# Patient Record
Sex: Male | Born: 1960 | Race: White | Hispanic: No | Marital: Married | Smoking: Never smoker
Health system: Southern US, Community
[De-identification: ages and names within clinical notes are randomized; demographics above are authoritative.]

## PROBLEM LIST (undated history)

## (undated) DIAGNOSIS — K219 Gastro-esophageal reflux disease without esophagitis: Secondary | ICD-10-CM

## (undated) DIAGNOSIS — I82409 Acute embolism and thrombosis of unspecified deep veins of unspecified lower extremity: Secondary | ICD-10-CM

## (undated) DIAGNOSIS — I10 Essential (primary) hypertension: Secondary | ICD-10-CM

## (undated) DIAGNOSIS — R51 Headache: Secondary | ICD-10-CM

## (undated) HISTORY — DX: Essential (primary) hypertension: I10

## (undated) HISTORY — PX: TONSILLECTOMY: SUR1361

## (undated) HISTORY — PX: LEG TENDON SURGERY: SHX1004

## (undated) HISTORY — PX: VASECTOMY: SHX75

---

## 2005-07-15 HISTORY — PX: HEEL SPUR SURGERY: SHX665

## 2009-07-15 DIAGNOSIS — I82409 Acute embolism and thrombosis of unspecified deep veins of unspecified lower extremity: Secondary | ICD-10-CM

## 2009-07-15 HISTORY — DX: Acute embolism and thrombosis of unspecified deep veins of unspecified lower extremity: I82.409

## 2010-04-17 ENCOUNTER — Ambulatory Visit: Payer: Self-pay | Admitting: Vascular Surgery

## 2010-04-17 ENCOUNTER — Emergency Department: Admission: RE | Admit: 2010-04-17 | Discharge: 2010-04-17 | Payer: Self-pay | Admitting: Family Medicine

## 2010-08-31 ENCOUNTER — Ambulatory Visit (HOSPITAL_COMMUNITY)
Admission: RE | Admit: 2010-08-31 | Discharge: 2010-08-31 | Disposition: A | Discharge status: Home / Self Care | Payer: Managed Care, Other (non HMO) | Source: Ambulatory Visit | Attending: Family Medicine | Admitting: Family Medicine

## 2010-08-31 DIAGNOSIS — I8000 Phlebitis and thrombophlebitis of superficial vessels of unspecified lower extremity: Secondary | ICD-10-CM | POA: Insufficient documentation

## 2011-10-08 ENCOUNTER — Encounter (HOSPITAL_BASED_OUTPATIENT_CLINIC_OR_DEPARTMENT_OTHER): Payer: Self-pay | Admitting: *Deleted

## 2011-10-08 ENCOUNTER — Emergency Department (INDEPENDENT_AMBULATORY_CARE_PROVIDER_SITE_OTHER): Payer: Managed Care, Other (non HMO)

## 2011-10-08 ENCOUNTER — Emergency Department (HOSPITAL_BASED_OUTPATIENT_CLINIC_OR_DEPARTMENT_OTHER)
Admission: EM | Admit: 2011-10-08 | Discharge: 2011-10-08 | Disposition: A | Discharge status: Home / Self Care | Payer: Managed Care, Other (non HMO) | Attending: Emergency Medicine | Admitting: Emergency Medicine

## 2011-10-08 DIAGNOSIS — M542 Cervicalgia: Secondary | ICD-10-CM | POA: Insufficient documentation

## 2011-10-08 DIAGNOSIS — S139XXA Sprain of joints and ligaments of unspecified parts of neck, initial encounter: Secondary | ICD-10-CM | POA: Insufficient documentation

## 2011-10-08 DIAGNOSIS — M5020 Other cervical disc displacement, unspecified cervical region: Secondary | ICD-10-CM

## 2011-10-08 DIAGNOSIS — S161XXA Strain of muscle, fascia and tendon at neck level, initial encounter: Secondary | ICD-10-CM

## 2011-10-08 DIAGNOSIS — M546 Pain in thoracic spine: Secondary | ICD-10-CM | POA: Insufficient documentation

## 2011-10-08 DIAGNOSIS — Z043 Encounter for examination and observation following other accident: Secondary | ICD-10-CM

## 2011-10-08 MED ORDER — HYDROCODONE-ACETAMINOPHEN 5-500 MG PO TABS: 1.0000 | ORAL_TABLET | Freq: Four times a day (QID) | ORAL | Status: AC | PRN
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
## 2012-05-29 ENCOUNTER — Encounter (HOSPITAL_BASED_OUTPATIENT_CLINIC_OR_DEPARTMENT_OTHER): Payer: Self-pay | Admitting: *Deleted

## 2012-05-29 NOTE — Progress Notes (Signed)
Newly dx HTN-not had ekg 2-3 yr-not sure where-will need ekg istat-works for [employer]-in [place] till Tuesday pm

## 2012-06-03 ENCOUNTER — Ambulatory Visit (HOSPITAL_BASED_OUTPATIENT_CLINIC_OR_DEPARTMENT_OTHER): Payer: Managed Care, Other (non HMO) | Admitting: Certified Registered"

## 2012-06-03 ENCOUNTER — Encounter (HOSPITAL_BASED_OUTPATIENT_CLINIC_OR_DEPARTMENT_OTHER): Payer: Self-pay | Admitting: Certified Registered"

## 2012-06-03 ENCOUNTER — Encounter (HOSPITAL_BASED_OUTPATIENT_CLINIC_OR_DEPARTMENT_OTHER): Payer: Self-pay | Admitting: *Deleted

## 2012-06-03 ENCOUNTER — Ambulatory Visit (HOSPITAL_BASED_OUTPATIENT_CLINIC_OR_DEPARTMENT_OTHER)
Admission: RE | Admit: 2012-06-03 | Discharge: 2012-06-03 | Disposition: A | Discharge status: Home / Self Care | Payer: Managed Care, Other (non HMO) | Source: Ambulatory Visit | Attending: Orthopedic Surgery | Admitting: Orthopedic Surgery

## 2012-06-03 ENCOUNTER — Encounter (HOSPITAL_BASED_OUTPATIENT_CLINIC_OR_DEPARTMENT_OTHER)
Admission: RE | Disposition: A | Discharge status: Home / Self Care | Payer: Self-pay | Source: Ambulatory Visit | Attending: Orthopedic Surgery

## 2012-06-03 DIAGNOSIS — I10 Essential (primary) hypertension: Secondary | ICD-10-CM | POA: Insufficient documentation

## 2012-06-03 DIAGNOSIS — M2020 Hallux rigidus, unspecified foot: Secondary | ICD-10-CM | POA: Insufficient documentation

## 2012-06-03 DIAGNOSIS — K219 Gastro-esophageal reflux disease without esophagitis: Secondary | ICD-10-CM | POA: Insufficient documentation

## 2012-06-03 HISTORY — PX: CHEILECTOMY: SHX1336

## 2012-06-03 HISTORY — DX: Essential (primary) hypertension: I10

## 2012-06-03 HISTORY — DX: Headache: R51

## 2012-06-03 HISTORY — DX: Acute embolism and thrombosis of unspecified deep veins of unspecified lower extremity: I82.409

## 2012-06-03 HISTORY — DX: Gastro-esophageal reflux disease without esophagitis: K21.9

## 2012-06-03 LAB — POCT I-STAT, CHEM 8
BUN: 15 mg/dL (ref 6–23)
Calcium, Ion: 1.13 mmol/L (ref 1.12–1.23)
Creatinine, Ser: 1 mg/dL (ref 0.50–1.35)
TCO2: 25 mmol/L (ref 0–100)

## 2012-06-03 SURGERY — CHEILECTOMY
Anesthesia: General | Site: Foot | Laterality: Bilateral | Wound class: Clean

## 2012-06-03 MED ORDER — SODIUM CHLORIDE 0.9 % IV SOLN: INTRAVENOUS | Status: DC

## 2012-06-03 MED ORDER — BUPIVACAINE HCL (PF) 0.5 % IJ SOLN
INTRAMUSCULAR | Status: DC | PRN
  Administered 2012-06-03: 4 mL
  Administered 2012-06-03: 3 mL

## 2012-06-03 MED ORDER — ONDANSETRON HCL 4 MG/2ML IJ SOLN
INTRAMUSCULAR | Status: DC | PRN
  Administered 2012-06-03: 4 mg via INTRAVENOUS

## 2012-06-03 MED ORDER — CHLORHEXIDINE GLUCONATE 4 % EX LIQD: 60.0000 mL | Freq: Once | CUTANEOUS | Status: DC

## 2012-06-03 MED ORDER — CEFAZOLIN SODIUM-DEXTROSE 2-3 GM-% IV SOLR
2.0000 g | INTRAVENOUS | Status: AC
  Administered 2012-06-03: 2 g via INTRAVENOUS

## 2012-06-03 MED ORDER — DEXAMETHASONE SODIUM PHOSPHATE 4 MG/ML IJ SOLN
INTRAMUSCULAR | Status: DC | PRN
  Administered 2012-06-03: 10 mg via INTRAVENOUS

## 2012-06-03 MED ORDER — METHOCARBAMOL 500 MG PO TABS: 500.0000 mg | ORAL_TABLET | Freq: Three times a day (TID) | ORAL | Status: DC

## 2012-06-03 MED ORDER — LIDOCAINE HCL (CARDIAC) 20 MG/ML IV SOLN
INTRAVENOUS | Status: DC | PRN
  Administered 2012-06-03: 70 mg via INTRAVENOUS

## 2012-06-03 MED ORDER — HYDROCODONE-ACETAMINOPHEN 5-325 MG PO TABS: 1.0000 | ORAL_TABLET | Freq: Four times a day (QID) | ORAL | Status: DC | PRN

## 2012-06-03 MED ORDER — DIPHENHYDRAMINE HCL 50 MG/ML IJ SOLN: 6.2500 mg | INTRAMUSCULAR | Status: DC | PRN

## 2012-06-03 MED ORDER — FENTANYL CITRATE 0.05 MG/ML IJ SOLN
INTRAMUSCULAR | Status: DC | PRN
  Administered 2012-06-03: 25 ug via INTRAVENOUS
  Administered 2012-06-03: 100 ug via INTRAVENOUS
  Administered 2012-06-03: 50 ug via INTRAVENOUS

## 2012-06-03 MED ORDER — OXYCODONE HCL 5 MG/5ML PO SOLN: 5.0000 mg | Freq: Once | ORAL | Status: DC | PRN

## 2012-06-03 MED ORDER — PROPOFOL 10 MG/ML IV BOLUS
INTRAVENOUS | Status: DC | PRN
  Administered 2012-06-03: 200 mg via INTRAVENOUS

## 2012-06-03 MED ORDER — HYDROMORPHONE HCL PF 1 MG/ML IJ SOLN
0.2500 mg | INTRAMUSCULAR | Status: DC | PRN
  Administered 2012-06-03 (×2): 0.5 mg via INTRAVENOUS

## 2012-06-03 MED ORDER — MIDAZOLAM HCL 5 MG/5ML IJ SOLN
INTRAMUSCULAR | Status: DC | PRN
  Administered 2012-06-03: 2 mg via INTRAVENOUS

## 2012-06-03 MED ORDER — LACTATED RINGERS IV SOLN
INTRAVENOUS | Status: DC
  Administered 2012-06-03 (×2): via INTRAVENOUS

## 2012-06-03 MED ORDER — OXYCODONE HCL 5 MG PO TABS
5.0000 mg | ORAL_TABLET | Freq: Once | ORAL | Status: DC | PRN
Start: 2012-06-03 — End: 2012-06-03

## 2012-06-03 MED ORDER — ONDANSETRON HCL 4 MG/2ML IJ SOLN: 4.0000 mg | Freq: Once | INTRAMUSCULAR | Status: DC | PRN

## 2012-06-03 SURGICAL SUPPLY — 54 items
BANDAGE ELASTIC 4 VELCRO ST LF (GAUZE/BANDAGES/DRESSINGS) ×4 IMPLANT
BLADE OSC/SAG .038X5.5 CUT EDG (BLADE) ×2 IMPLANT
BLADE SURG 15 STRL LF DISP TIS (BLADE) ×3 IMPLANT
BLADE SURG 15 STRL SS (BLADE) ×3
BRUSH SCRUB EZ PLAIN DRY (MISCELLANEOUS) ×4 IMPLANT
CLOTH BEACON ORANGE TIMEOUT ST (SAFETY) ×2 IMPLANT
COVER TABLE BACK 60X90 (DRAPES) ×2 IMPLANT
CUFF TOURNIQUET SINGLE 34IN LL (TOURNIQUET CUFF) ×4 IMPLANT
DRAPE EXTREMITY T 121X128X90 (DRAPE) ×4 IMPLANT
DRAPE SURG 17X23 STRL (DRAPES) ×4 IMPLANT
DRSG PAD ABDOMINAL 8X10 ST (GAUZE/BANDAGES/DRESSINGS) ×2 IMPLANT
DURA STEPPER LG (CAST SUPPLIES) IMPLANT
DURA STEPPER MED (CAST SUPPLIES) IMPLANT
DURA STEPPER SML (CAST SUPPLIES) IMPLANT
ELECT REM PT RETURN 9FT ADLT (ELECTROSURGICAL) ×2
ELECTRODE REM PT RTRN 9FT ADLT (ELECTROSURGICAL) ×1 IMPLANT
GAUZE SPONGE 4X4 16PLY XRAY LF (GAUZE/BANDAGES/DRESSINGS) IMPLANT
GAUZE XEROFORM 1X8 LF (GAUZE/BANDAGES/DRESSINGS) ×2 IMPLANT
GLOVE BIO SURGEON STRL SZ 6.5 (GLOVE) ×2 IMPLANT
GLOVE BIO SURGEON STRL SZ8 (GLOVE) ×2 IMPLANT
GLOVE BIOGEL PI IND STRL 7.0 (GLOVE) ×1 IMPLANT
GLOVE BIOGEL PI IND STRL 8 (GLOVE) ×2 IMPLANT
GLOVE BIOGEL PI INDICATOR 7.0 (GLOVE) ×1
GLOVE BIOGEL PI INDICATOR 8 (GLOVE) ×2
GLOVE SURG SS PI 8.0 STRL IVOR (GLOVE) ×2 IMPLANT
GOWN BRE IMP PREV XXLGXLNG (GOWN DISPOSABLE) ×2 IMPLANT
GOWN PREVENTION PLUS XLARGE (GOWN DISPOSABLE) ×4 IMPLANT
NEEDLE HYPO 22GX1.5 SAFETY (NEEDLE) ×2 IMPLANT
NS IRRIG 1000ML POUR BTL (IV SOLUTION) ×4 IMPLANT
PACK BASIN DAY SURGERY FS (CUSTOM PROCEDURE TRAY) ×2 IMPLANT
PAD CAST 4YDX4 CTTN HI CHSV (CAST SUPPLIES) ×1 IMPLANT
PADDING CAST ABS 4INX4YD NS (CAST SUPPLIES) ×1
PADDING CAST ABS COTTON 4X4 ST (CAST SUPPLIES) ×1 IMPLANT
PADDING CAST COTTON 4X4 STRL (CAST SUPPLIES) ×1
PENCIL BUTTON HOLSTER BLD 10FT (ELECTRODE) ×2 IMPLANT
SHEET MEDIUM DRAPE 40X70 STRL (DRAPES) ×4 IMPLANT
SPONGE GAUZE 4X4 12PLY (GAUZE/BANDAGES/DRESSINGS) ×4 IMPLANT
STOCKINETTE 6  STRL (DRAPES) ×2
STOCKINETTE 6 STRL (DRAPES) ×2 IMPLANT
SUCTION FRAZIER TIP 10 FR DISP (SUCTIONS) IMPLANT
SUT BONE WAX W31G (SUTURE) ×2 IMPLANT
SUT ETHILON 4 0 PS 2 18 (SUTURE) ×4 IMPLANT
SUT MON AB 4-0 PC3 18 (SUTURE) IMPLANT
SUT VIC AB 2-0 PS2 27 (SUTURE) IMPLANT
SUT VIC AB 3-0 PS1 18 (SUTURE) ×2
SUT VIC AB 3-0 PS1 18XBRD (SUTURE) ×2 IMPLANT
SYR 20CC LL (SYRINGE) IMPLANT
SYR BULB 3OZ (MISCELLANEOUS) ×2 IMPLANT
SYR CONTROL 10ML LL (SYRINGE) IMPLANT
TOWEL OR 17X24 6PK STRL BLUE (TOWEL DISPOSABLE) ×2 IMPLANT
TOWEL OR NON WOVEN STRL DISP B (DISPOSABLE) ×2 IMPLANT
TUBE CONNECTING 20X1/4 (TUBING) ×4 IMPLANT
UNDERPAD 30X30 INCONTINENT (UNDERPADS AND DIAPERS) ×2 IMPLANT
WATER STERILE IRR 1000ML POUR (IV SOLUTION) IMPLANT

## 2012-06-03 NOTE — Brief Op Note (Signed)
06/03/2012  3:05 PM  PATIENT:  [place]  51 y.o. male  PRE-OPERATIVE DIAGNOSIS:  bilateral halux rigidus  POST-OPERATIVE DIAGNOSIS:  bilateral halux rigidus  PROCEDURE:  Procedure(s) (LRB) with comments: CHEILECTOMY (Bilateral) - bilateral great toe cheilectomy  SURGEON:  Surgeon(s) and Role:    * Sherri Rad, MD - Primary  PHYSICIAN ASSISTANT: Rexene Edison, PAC   ASSISTANTS: Rexene Edison, [facility]    ANESTHESIA:   general  EBL:  Total I/O In: 1400 [I.V.:1400] Out: -   BLOOD ADMINISTERED:none  DRAINS: none   LOCAL MEDICATIONS USED:  MARCAINE     SPECIMEN:  No Specimen  DISPOSITION OF SPECIMEN:  N/A  COUNTS:  YES  TOURNIQUET:   Total Tourniquet Time Documented: Thigh (Left) - 48 minutes Thigh (Right) - 48 minutes  DICTATION: .Other Dictation: Dictation Number [id number]  PLAN OF CARE: Discharge to home after PACU  PATIENT DISPOSITION:  PACU - hemodynamically stable.   Delay start of Pharmacological VTE agent (>24hrs) due to surgical blood loss or risk of bleeding: no

## 2012-06-03 NOTE — Progress Notes (Signed)
Pt states itching is better.  Does not want to fill Norco RX "afraid it may cause itching". States in past had itching with oxycodone did talk with Delane Ginger last week  And was going to try norco but now changed mind. TC to Dr. Lestine Box office RX changed to Tramadol called both Rx for Tramadol and Robaxin to [facility]. Pt and girlfriend aware. Dr Jean Rosenthal wants pt to wait until Rx picked up and then ok to take pain med since last dose of Dilaudid at 1531. Pt and girlfriend verbalized understanding. Printed RX shredded.

## 2012-06-03 NOTE — H&P (Signed)
  H&P documentation: Placed to be scanned history and physical exam in chart.  -History and Physical Reviewed  -Patient has been re-examined  -No change in the plan of care  [place] A  

## 2012-06-03 NOTE — Anesthesia Procedure Notes (Addendum)
Performed by: Verlan Friends   Procedure Name: LMA Insertion Date/Time: 06/03/2012 1:38 PM Performed by: Verlan Friends Pre-anesthesia Checklist: Patient identified, Emergency Drugs available, Suction available, Patient being monitored and Timeout performed Patient Re-evaluated:Patient Re-evaluated prior to inductionOxygen Delivery Method: Circle System Utilized Preoxygenation: Pre-oxygenation with 100% oxygen Intubation Type: IV induction Ventilation: Mask ventilation without difficulty LMA: LMA inserted LMA Size: 5.0 Number of attempts: 1 Airway Equipment and Method: bite block Placement Confirmation: positive ETCO2 Tube secured with: Tape Dental Injury: Teeth and Oropharynx as per pre-operative assessment  Comments: Incisal small chips on #8 and 9.  This was noted before insertion of LMA

## 2012-06-03 NOTE — Anesthesia Postprocedure Evaluation (Signed)
  Anesthesia Post-op Note  Patient: [place]  Procedure(s) Performed: Procedure(s) (LRB) with comments: CHEILECTOMY (Bilateral) - bilateral great toe cheilectomy  Patient Location: PACU  Anesthesia Type:General  Level of Consciousness: awake, alert , oriented and patient cooperative  Airway and Oxygen Therapy: Patient Spontanous Breathing  Post-op Pain: mild  Post-op Assessment: Post-op Vital signs reviewed, Patient's Cardiovascular Status Stable, Respiratory Function Stable, Patent Airway, No signs of Nausea or vomiting, Adequate PO intake and Pain level controlled  Post-op Vital Signs: Reviewed and stable  Complications: No apparent anesthesia complications

## 2012-06-03 NOTE — Transfer of Care (Signed)
Immediate Anesthesia Transfer of Care Note  Patient: [place]  Procedure(s) Performed: Procedure(s) (LRB) with comments: CHEILECTOMY (Bilateral) - bilateral great toe cheilectomy  Patient Location: PACU  Anesthesia Type:General  Level of Consciousness: awake, alert , oriented and patient cooperative  Airway & Oxygen Therapy: Patient Spontanous Breathing and Patient connected to face mask oxygen  Post-op Assessment: Report given to PACU RN and Post -op Vital signs reviewed and stable  Post vital signs: Reviewed and stable  Complications: No apparent anesthesia complications

## 2012-06-03 NOTE — Anesthesia Preprocedure Evaluation (Signed)
Anesthesia Evaluation  Patient identified by MRN, date of birth, ID band Patient awake    Reviewed: Allergy & Precautions, H&P , NPO status , Patient's Chart, lab work & pertinent test results  Airway Mallampati: I TM Distance: >3 FB Neck ROM: Full    Dental  (+) Teeth Intact and Dental Advisory Given,    Pulmonary  breath sounds clear to auscultation        Cardiovascular hypertension, Pt. on medications Rhythm:Regular Rate:Normal     Neuro/Psych  Headaches,    GI/Hepatic GERD-  Medicated and Controlled,  Endo/Other    Renal/GU      Musculoskeletal   Abdominal   Peds  Hematology   Anesthesia Other Findings   Reproductive/Obstetrics                           Anesthesia Physical Anesthesia Plan  ASA: II  Anesthesia Plan: General   Post-op Pain Management:    Induction: Intravenous  Airway Management Planned: LMA  Additional Equipment:   Intra-op Plan:   Post-operative Plan: Extubation in OR  Informed Consent: I have reviewed the patients History and Physical, chart, labs and discussed the procedure including the risks, benefits and alternatives for the proposed anesthesia with the patient or authorized representative who has indicated his/her understanding and acceptance.   Dental advisory given  Plan Discussed with: CRNA, Anesthesiologist and Surgeon  Anesthesia Plan Comments:         Anesthesia Quick Evaluation

## 2012-06-04 ENCOUNTER — Encounter (HOSPITAL_BASED_OUTPATIENT_CLINIC_OR_DEPARTMENT_OTHER): Payer: Self-pay | Admitting: Orthopedic Surgery

## 2012-06-04 NOTE — Op Note (Signed)
NAMEMarland Kitchen  MILLARD, DONAGHY              ACCOUNT NO.:  [id number]  MEDICAL RECORD NO.:  [id number]  LOCATION:                                 FACILITY:  PHYSICIAN:  Leonides Grills, M.D.     DATE OF BIRTH:  07/23/1960  DATE OF PROCEDURE:  06/03/2012 DATE OF DISCHARGE:                              OPERATIVE REPORT   PREOPERATIVE DIAGNOSIS:  Bilateral hallux rigidus.  POSTOPERATIVE DIAGNOSIS:  Bilateral hallux rigidus.  PROCEDURE PERFORMED:  Bilateral great toe cheilectomies.  ANESTHESIA:  General.  SURGEON:  Leonides Grills, M.D.  ASSISTANT:  Richardean Canal, PA-C  ESTIMATED BLOOD LOSS:  Minimal.  TOURNIQUET TIME:  COMPLICATIONS:  None.  DISPOSITION:  Stable to PR.  INDICATION:  This is a 51 year old gentleman, who has had persistent prolonged and progressive dorsal bilateral great toe pain at the MTP joint.  Due to the above pathology, it was interfering with his life despite conservative management.  He was consented for the procedure. All risks, infection, vessel injury, persistent pain, worse pain, prolonged recovery, stiffness, arthritis, possibility of future fusion, DVT, PE were all explained.  Questions were encouraged and answered.  OPERATIVE NOTE:  The patient was brought to the operating room and placed in supine position.  After adequate general anesthesia administered as well as Ancef 1 g IV piggyback, bilateral lower extremities were prepped and draped in a sterile manner, proximally placed thigh tourniquets.  Limb was gravity exsanguinated.  Tourniquet was elevated to 290 mmHg.  A longitudinal incision on the dorsomedial aspect, left great toe MTP joint was then made.  Dissection was carried down through skin.  Hemostasis was obtained.  EHL tendon was identified and protected within its tenosynovial sheath and retracted out of harm's way laterally throughout the case.  Longitudinal capsulotomy was then made.  Soft tissue was elevated medial and laterally, then with  a sagittal saw and the dorsal spur off the first metatarsal head was then removed.  Once this was done, the spur off the dorsal aspect of the proximal phalanx was removed as well.  The remaining part of cartilage looked good.  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  We then explored both the medial and lateral gutters and spurs removed from this area as well.  We then ranged the joint after the area was copiously irrigated with normal saline, and there was no impingement whatsoever.  The range of motion of the great toe and MTP joint was significantly improved.  The area was copiously irrigated with normal saline once again.  Bone wax applied to exposed bony surfaces. The capsule closed with 3-0 Vicryl stitch protecting the EHL tendon at all times.  Tourniquet was deflated and hemostasis was obtained.  There was no pulsatile bleeding.  The skin was closed with 4-0 nylon stitch. The exact same procedure was done to the right side as well, as described for the left.  Sterile dressings were applied.  Hard-sole shoe was applied.  The patient stable to PR.     Leonides Grills, M.D.     PB/MEDQ  D:  06/03/2012  T:  06/04/2012  Job:  [id number]

## 2012-12-11 ENCOUNTER — Ambulatory Visit (INDEPENDENT_AMBULATORY_CARE_PROVIDER_SITE_OTHER): Payer: BLUE CROSS/BLUE SHIELD | Admitting: Psychology

## 2012-12-11 DIAGNOSIS — F4322 Adjustment disorder with anxiety: Secondary | ICD-10-CM

## 2012-12-23 ENCOUNTER — Ambulatory Visit (INDEPENDENT_AMBULATORY_CARE_PROVIDER_SITE_OTHER): Payer: BLUE CROSS/BLUE SHIELD | Admitting: Psychology

## 2012-12-23 DIAGNOSIS — F4322 Adjustment disorder with anxiety: Secondary | ICD-10-CM

## 2012-12-28 ENCOUNTER — Ambulatory Visit (INDEPENDENT_AMBULATORY_CARE_PROVIDER_SITE_OTHER): Payer: BLUE CROSS/BLUE SHIELD | Admitting: Psychology

## 2012-12-28 DIAGNOSIS — F4323 Adjustment disorder with mixed anxiety and depressed mood: Secondary | ICD-10-CM

## 2013-01-04 ENCOUNTER — Ambulatory Visit (INDEPENDENT_AMBULATORY_CARE_PROVIDER_SITE_OTHER): Payer: BLUE CROSS/BLUE SHIELD | Admitting: Psychology

## 2013-01-04 DIAGNOSIS — F4322 Adjustment disorder with anxiety: Secondary | ICD-10-CM

## 2013-01-11 ENCOUNTER — Ambulatory Visit (INDEPENDENT_AMBULATORY_CARE_PROVIDER_SITE_OTHER): Payer: BLUE CROSS/BLUE SHIELD | Admitting: Psychology

## 2013-01-11 DIAGNOSIS — F4322 Adjustment disorder with anxiety: Secondary | ICD-10-CM

## 2013-01-20 ENCOUNTER — Ambulatory Visit: Payer: BLUE CROSS/BLUE SHIELD | Admitting: Psychology

## 2013-01-25 ENCOUNTER — Ambulatory Visit: Payer: BLUE CROSS/BLUE SHIELD | Admitting: Psychology

## 2013-02-08 ENCOUNTER — Ambulatory Visit (INDEPENDENT_AMBULATORY_CARE_PROVIDER_SITE_OTHER): Payer: BLUE CROSS/BLUE SHIELD | Admitting: Psychology

## 2013-02-08 DIAGNOSIS — F4322 Adjustment disorder with anxiety: Secondary | ICD-10-CM

## 2013-02-15 ENCOUNTER — Ambulatory Visit (INDEPENDENT_AMBULATORY_CARE_PROVIDER_SITE_OTHER): Payer: BLUE CROSS/BLUE SHIELD | Admitting: Psychology

## 2013-02-15 DIAGNOSIS — F4322 Adjustment disorder with anxiety: Secondary | ICD-10-CM

## 2013-02-22 ENCOUNTER — Ambulatory Visit (INDEPENDENT_AMBULATORY_CARE_PROVIDER_SITE_OTHER): Payer: BLUE CROSS/BLUE SHIELD | Admitting: Psychology

## 2013-02-22 DIAGNOSIS — F4322 Adjustment disorder with anxiety: Secondary | ICD-10-CM

## 2013-02-25 ENCOUNTER — Ambulatory Visit (INDEPENDENT_AMBULATORY_CARE_PROVIDER_SITE_OTHER): Payer: BLUE CROSS/BLUE SHIELD | Admitting: Licensed Clinical Social Worker

## 2013-02-25 DIAGNOSIS — F4322 Adjustment disorder with anxiety: Secondary | ICD-10-CM

## 2013-03-01 ENCOUNTER — Ambulatory Visit: Payer: BLUE CROSS/BLUE SHIELD | Admitting: Psychology

## 2013-03-01 ENCOUNTER — Ambulatory Visit (INDEPENDENT_AMBULATORY_CARE_PROVIDER_SITE_OTHER): Payer: BLUE CROSS/BLUE SHIELD | Admitting: Licensed Clinical Social Worker

## 2013-03-01 DIAGNOSIS — F4322 Adjustment disorder with anxiety: Secondary | ICD-10-CM

## 2013-03-08 ENCOUNTER — Ambulatory Visit (INDEPENDENT_AMBULATORY_CARE_PROVIDER_SITE_OTHER): Payer: BLUE CROSS/BLUE SHIELD | Admitting: Psychology

## 2013-03-08 DIAGNOSIS — F4322 Adjustment disorder with anxiety: Secondary | ICD-10-CM

## 2013-03-09 ENCOUNTER — Ambulatory Visit: Payer: BLUE CROSS/BLUE SHIELD | Admitting: Licensed Clinical Social Worker

## 2013-03-09 ENCOUNTER — Ambulatory Visit (INDEPENDENT_AMBULATORY_CARE_PROVIDER_SITE_OTHER): Payer: BLUE CROSS/BLUE SHIELD | Admitting: Licensed Clinical Social Worker

## 2013-03-09 DIAGNOSIS — F4323 Adjustment disorder with mixed anxiety and depressed mood: Secondary | ICD-10-CM

## 2013-03-16 ENCOUNTER — Ambulatory Visit: Payer: BLUE CROSS/BLUE SHIELD | Admitting: Psychology

## 2013-03-17 ENCOUNTER — Ambulatory Visit (INDEPENDENT_AMBULATORY_CARE_PROVIDER_SITE_OTHER): Payer: BLUE CROSS/BLUE SHIELD | Admitting: Licensed Clinical Social Worker

## 2013-03-17 DIAGNOSIS — F4323 Adjustment disorder with mixed anxiety and depressed mood: Secondary | ICD-10-CM

## 2013-03-22 ENCOUNTER — Ambulatory Visit (INDEPENDENT_AMBULATORY_CARE_PROVIDER_SITE_OTHER): Payer: BLUE CROSS/BLUE SHIELD | Admitting: Psychology

## 2013-03-22 DIAGNOSIS — F4322 Adjustment disorder with anxiety: Secondary | ICD-10-CM

## 2013-03-23 ENCOUNTER — Ambulatory Visit (INDEPENDENT_AMBULATORY_CARE_PROVIDER_SITE_OTHER): Payer: BLUE CROSS/BLUE SHIELD | Admitting: Licensed Clinical Social Worker

## 2013-03-23 DIAGNOSIS — F4323 Adjustment disorder with mixed anxiety and depressed mood: Secondary | ICD-10-CM

## 2013-03-29 ENCOUNTER — Ambulatory Visit (INDEPENDENT_AMBULATORY_CARE_PROVIDER_SITE_OTHER): Payer: BLUE CROSS/BLUE SHIELD | Admitting: Psychology

## 2013-03-29 DIAGNOSIS — F4322 Adjustment disorder with anxiety: Secondary | ICD-10-CM

## 2013-04-01 ENCOUNTER — Ambulatory Visit (INDEPENDENT_AMBULATORY_CARE_PROVIDER_SITE_OTHER): Payer: BLUE CROSS/BLUE SHIELD | Admitting: Licensed Clinical Social Worker

## 2013-04-01 DIAGNOSIS — F4322 Adjustment disorder with anxiety: Secondary | ICD-10-CM

## 2013-04-05 ENCOUNTER — Ambulatory Visit (INDEPENDENT_AMBULATORY_CARE_PROVIDER_SITE_OTHER): Payer: BLUE CROSS/BLUE SHIELD | Admitting: Psychology

## 2013-04-05 DIAGNOSIS — F4322 Adjustment disorder with anxiety: Secondary | ICD-10-CM

## 2013-04-12 ENCOUNTER — Ambulatory Visit: Payer: 59 | Admitting: Psychology

## 2013-04-19 ENCOUNTER — Ambulatory Visit: Payer: BLUE CROSS/BLUE SHIELD | Admitting: Psychology

## 2013-04-19 ENCOUNTER — Ambulatory Visit (INDEPENDENT_AMBULATORY_CARE_PROVIDER_SITE_OTHER): Payer: BLUE CROSS/BLUE SHIELD | Admitting: Psychology

## 2013-04-19 DIAGNOSIS — F4322 Adjustment disorder with anxiety: Secondary | ICD-10-CM

## 2013-04-26 ENCOUNTER — Ambulatory Visit: Payer: Self-pay | Admitting: Psychology

## 2013-05-03 ENCOUNTER — Ambulatory Visit: Payer: 59 | Admitting: Psychology

## 2013-05-06 ENCOUNTER — Ambulatory Visit (INDEPENDENT_AMBULATORY_CARE_PROVIDER_SITE_OTHER): Payer: BLUE CROSS/BLUE SHIELD | Admitting: Psychology

## 2013-05-06 DIAGNOSIS — F4322 Adjustment disorder with anxiety: Secondary | ICD-10-CM

## 2013-05-24 ENCOUNTER — Ambulatory Visit (INDEPENDENT_AMBULATORY_CARE_PROVIDER_SITE_OTHER): Payer: BLUE CROSS/BLUE SHIELD | Admitting: Psychology

## 2013-05-24 DIAGNOSIS — F4322 Adjustment disorder with anxiety: Secondary | ICD-10-CM

## 2013-05-31 ENCOUNTER — Ambulatory Visit (INDEPENDENT_AMBULATORY_CARE_PROVIDER_SITE_OTHER): Payer: BLUE CROSS/BLUE SHIELD | Admitting: Psychology

## 2013-05-31 DIAGNOSIS — F4322 Adjustment disorder with anxiety: Secondary | ICD-10-CM

## 2013-06-14 ENCOUNTER — Ambulatory Visit: Payer: 59 | Admitting: Psychology

## 2013-06-21 ENCOUNTER — Ambulatory Visit: Payer: 59 | Admitting: Psychology

## 2013-06-28 ENCOUNTER — Ambulatory Visit (INDEPENDENT_AMBULATORY_CARE_PROVIDER_SITE_OTHER): Payer: BLUE CROSS/BLUE SHIELD | Admitting: Psychology

## 2013-06-28 DIAGNOSIS — F4322 Adjustment disorder with anxiety: Secondary | ICD-10-CM

## 2013-07-05 ENCOUNTER — Ambulatory Visit (INDEPENDENT_AMBULATORY_CARE_PROVIDER_SITE_OTHER): Payer: BLUE CROSS/BLUE SHIELD | Admitting: Psychology

## 2013-07-05 DIAGNOSIS — F4322 Adjustment disorder with anxiety: Secondary | ICD-10-CM
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
## 2013-08-05 ENCOUNTER — Ambulatory Visit (INDEPENDENT_AMBULATORY_CARE_PROVIDER_SITE_OTHER): Payer: 59 | Admitting: Psychology

## 2013-08-05 DIAGNOSIS — F4322 Adjustment disorder with anxiety: Secondary | ICD-10-CM

## 2013-09-02 ENCOUNTER — Ambulatory Visit (INDEPENDENT_AMBULATORY_CARE_PROVIDER_SITE_OTHER): Payer: 59 | Admitting: Psychology

## 2013-09-02 DIAGNOSIS — F4322 Adjustment disorder with anxiety: Secondary | ICD-10-CM

## 2013-10-01 ENCOUNTER — Ambulatory Visit: Payer: 59 | Admitting: Psychology

## 2014-02-01 ENCOUNTER — Other Ambulatory Visit: Payer: Self-pay | Admitting: Family Medicine

## 2014-02-01 DIAGNOSIS — I809 Phlebitis and thrombophlebitis of unspecified site: Secondary | ICD-10-CM

## 2014-02-02 ENCOUNTER — Ambulatory Visit
Admission: RE | Admit: 2014-02-02 | Discharge: 2014-02-02 | Disposition: A | Discharge status: Home / Self Care | Payer: 59 | Source: Ambulatory Visit | Attending: Family Medicine | Admitting: Family Medicine

## 2014-02-02 DIAGNOSIS — I809 Phlebitis and thrombophlebitis of unspecified site: Secondary | ICD-10-CM

## 2014-03-04 ENCOUNTER — Telehealth: Payer: Self-pay | Admitting: Hematology & Oncology

## 2014-03-04 NOTE — Telephone Encounter (Signed)
Left vm today to schedule  NEW PATIENT appt for next week per Dr. E. °

## 2014-03-09 ENCOUNTER — Encounter: Payer: Self-pay | Admitting: Hematology & Oncology

## 2014-03-10 ENCOUNTER — Telehealth: Payer: Self-pay | Admitting: Hematology & Oncology

## 2014-03-10 NOTE — Telephone Encounter (Signed)
Left vm w NEW PATIENT today to remind them of their appointment with Dr. Ennever. Also, advised them to bring all medication bottles and insurance card information. ° °

## 2014-03-11 ENCOUNTER — Ambulatory Visit: Payer: Self-pay

## 2014-03-11 ENCOUNTER — Other Ambulatory Visit: Payer: Self-pay | Admitting: Lab

## 2014-03-11 ENCOUNTER — Ambulatory Visit: Payer: Self-pay | Admitting: Hematology & Oncology

## 2014-03-15 ENCOUNTER — Encounter: Payer: Self-pay | Admitting: Hematology & Oncology

## 2014-03-15 ENCOUNTER — Ambulatory Visit: Payer: 59

## 2014-03-15 ENCOUNTER — Ambulatory Visit (HOSPITAL_BASED_OUTPATIENT_CLINIC_OR_DEPARTMENT_OTHER): Payer: 59 | Admitting: Hematology & Oncology

## 2014-03-15 ENCOUNTER — Other Ambulatory Visit (HOSPITAL_BASED_OUTPATIENT_CLINIC_OR_DEPARTMENT_OTHER): Payer: 59 | Admitting: Lab

## 2014-03-15 VITALS — BP 132/81 | HR 65 | Temp 98.2°F | Resp 18 | Ht 72.0 in | Wt 202.0 lb

## 2014-03-15 DIAGNOSIS — M7989 Other specified soft tissue disorders: Secondary | ICD-10-CM

## 2014-03-15 DIAGNOSIS — I749 Embolism and thrombosis of unspecified artery: Secondary | ICD-10-CM

## 2014-03-15 DIAGNOSIS — Z86718 Personal history of other venous thrombosis and embolism: Secondary | ICD-10-CM

## 2014-03-15 DIAGNOSIS — Z7982 Long term (current) use of aspirin: Secondary | ICD-10-CM

## 2014-03-15 DIAGNOSIS — I82409 Acute embolism and thrombosis of unspecified deep veins of unspecified lower extremity: Secondary | ICD-10-CM

## 2014-03-15 LAB — CBC WITH DIFFERENTIAL (CANCER CENTER ONLY)
BASO#: 0 10*3/uL (ref 0.0–0.2)
BASO%: 0.7 % (ref 0.0–2.0)
EOS%: 3.2 % (ref 0.0–7.0)
Eosinophils Absolute: 0.2 10*3/uL (ref 0.0–0.5)
HEMATOCRIT: 45.2 % (ref 38.7–49.9)
HEMOGLOBIN: 16 g/dL (ref 13.0–17.1)
LYMPH#: 1.9 10*3/uL (ref 0.9–3.3)
LYMPH%: 32 % (ref 14.0–48.0)
MCH: 32.2 pg (ref 28.0–33.4)
MCHC: 35.4 g/dL (ref 32.0–35.9)
MCV: 91 fL (ref 82–98)
MONO#: 0.5 10*3/uL (ref 0.1–0.9)
MONO%: 8.9 % (ref 0.0–13.0)
NEUT#: 3.2 10*3/uL (ref 1.5–6.5)
NEUT%: 55.2 % (ref 40.0–80.0)
Platelets: 214 10*3/uL (ref 145–400)
RBC: 4.97 10*6/uL (ref 4.20–5.70)
RDW: 12.4 % (ref 11.1–15.7)
WBC: 5.9 10*3/uL (ref 4.0–10.0)

## 2014-03-15 NOTE — Progress Notes (Signed)
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 He had a DVT in his left calve 5 years ago. This was treated with lovenox and coumadin and is resolved. Last year he had to drive to [place] and back 4 times and developed a superficial thrombus in his left lower leg. This happened again this year when he had to make 5-6 trips to [place] after his father passed away. His superficial thrombus developed in June in his lower left leg. He has not taken any blood thinners with this. He does take 325 of asprin daily. He wear a compression sock on that leg. He rides several miles on his bike daily. His father passed away from metastatic liver cancer this year. He has no personal or family history of blood disorders that he is aware of. His maternal grandmother had varicose veins. He is an [place] in [place]. He was born in [place] and has lived in [place] for several years. He does not smoke or drink. He has had no bleeding or pain. He denies fever, chills, n/v, cough, rash, headache, dizziness, SOB, chest pain, palpitations, abdominal pain, constipation, diarrhea, blood in urine or stool. He has some swelling and tenderness in his left foot and leg at times. He denies numbness or tingling. His appetite is good and he is drinking plenty of fluids.   ROS: All other 10 point review of systems is negative except for those issues mentioned above.    PAST MEDICAL HISTORY:   Past Medical History  Diagnosis Date  . Hypertension   . DVT (deep venous thrombosis) 2011    left lower leg   . GERD  (gastroesophageal reflux disease)   . Headache(784.0)    ALLERGIES: Allergies  Allergen Reactions  . Lisinopril Cough  . Enoxaparin Sodium Rash    MEDICATIONS:  Current Outpatient Prescriptions on File Prior to Visit  Medication Sig Dispense Refill  . amphetamine-dextroamphetamine (ADDERALL XR) 30 MG 24 hr capsule Take 30 mg by mouth every morning.      Marland Kitchen aspirin 81 MG tablet Take 81 mg by mouth daily.      . SUMAtriptan (IMITREX) 100 MG tablet Take 100 mg by mouth every 2 (two) hours as needed. For migraine       No current facility-administered medications on file prior to visit.   PAST SURGICAL HISTORY Past Surgical History  Procedure Laterality Date  . Leg tendon surgery    . Tonsillectomy    . Vasectomy    . Heel spur surgery  2007    right  . Cheilectomy  06/03/2012    Procedure: CHEILECTOMY;  Surgeon: [place] Rad, MD;  Location: [place];  Service: Orthopedics;  Laterality: Bilateral;  bilateral great toe cheilectomy   FAMILY HISTORY: No family history on file.  SOCIAL HISTORY:  reports that he quit smoking about 31 years ago. His smoking use included Cigarettes. He started smoking about 39 years ago. He has a 4 pack-year smoking history. He has never used smokeless tobacco. He reports that he drinks alcohol. He reports that he does not use  illicit drugs.  PERFORMANCE STATUS: The patient's performance status is 0 - Asymptomatic  PHYSICAL EXAM: Most Recent Vital Signs: Blood pressure 132/81, pulse 65, temperature 98.2 F (36.8 C), temperature source Oral, resp. rate 18, height 6' (1.829 m), weight 202 lb (91.627 kg). BP 132/81  Pulse 65  Temp(Src) 98.2 F (36.8 C) (Oral)  Resp 18  Ht 6' (1.829 m)  Wt 202 lb (91.627 kg)  BMI 27.39 kg/m2  General Appearance:    Alert, cooperative, no distress, appears stated age  Head:    Normocephalic, without obvious abnormality, atraumatic  Eyes:    PERRL, conjunctiva/corneas clear, EOM's intact, fundi     benign, both eyes             Throat:   Lips, mucosa, and tongue normal; teeth and gums normal  Neck:   Supple, symmetrical, trachea midline, no adenopathy;       thyroid:  No enlargement/tenderness/nodules; no carotid   bruit or JVD  Back:     Symmetric, no curvature, ROM normal, no CVA tenderness  Lungs:     Clear to auscultation bilaterally, respirations unlabored  Chest wall:    No tenderness or deformity  Heart:    Regular rate and rhythm, S1 and S2 normal, no murmur, rub   or gallop  Abdomen:     Soft, non-tender, bowel sounds active all four quadrants,    no masses, no organomegaly        Extremities:   Extremities normal, atraumatic, no cyanosis or edema  Pulses:   2+ and symmetric all extremities  Skin:   Skin color, texture, turgor normal, no rashes or lesions  Lymph nodes:   Cervical, supraclavicular, and axillary nodes normal  Neurologic:   CNII-XII intact. Normal strength, sensation and reflexes      throughout   LABORATORY DATA:  Results for orders placed in visit on 03/15/14 (from the past 48 hour(s))  CBC WITH DIFFERENTIAL ([facility])     Status: None   Collection Time    03/15/14 12:06 PM      Result Value Ref Range   WBC 5.9  4.0 - 10.0 10e3/uL   RBC 4.97  4.20 - 5.70 10e6/uL   HGB 16.0  13.0 - 17.1 g/dL   HCT 40.9  81.1 - 91.4 %   MCV 91  82 - 98 fL   MCH 32.2  28.0 - 33.4 pg   MCHC 35.4  32.0 - 35.9 g/dL   RDW 78.2  95.6 - 21.3 %   Platelets 214  145 - 400 10e3/uL   NEUT# 3.2  1.5 - 6.5 10e3/uL   LYMPH# 1.9  0.9 - 3.3 10e3/uL   MONO# 0.5  0.1 - 0.9 10e3/uL   Eosinophils Absolute 0.2  0.0 - 0.5 10e3/uL   BASO# 0.0  0.0 - 0.2 10e3/uL   NEUT% 55.2  40.0 - 80.0 %   LYMPH% 32.0  14.0 - 48.0 %   MONO% 8.9  0.0 - 13.0 %   EOS% 3.2  0.0 - 7.0 %   BASO% 0.7  0.0 - 2.0 %     RADIOGRAPHY: No results found.     PATHOLOGY:  None  ASSESSMENT/PLAN:  E[place]ardo Valdez is e very pleasant white male with a history of recurrent superficial thrombophlebitis. This  has been an issue for 6-7 years. He had a DVT in his left calve 5 years ago. This was treated with lovenox and coumadin and is resolved. He has had no blood  thinner for the superficial thrombus. His most recent superficial thrombus occurred in June. It is much better according to him. The pain and swelling are almost gone. He wears a compression sock and takes 325 of asprin daily. We will have him start taking Folic Acid 400 mcg daily.  His CBC was normal. We will wait and see what the rest of his labs show. I do not feel that we need to schedule a follow-up appointment at this time.  He knows to call and come in if he has any issues.  All questions were answered. The patient knows to call the clinic with any problems, questions or concerns. We can certainly see the patient much sooner if necessary.  The patient was discussed with and also seen by Dr. Myna Hidalgo and he is in agreement with the aforementioned.   [facility]   Addendum by Dr. Myna Hidalgo:  I saw and examined the patient with Crystal Scarberry.  He has not had an actual DVT for 5 years.  He had a superficial thrombus in the left leg 2 months ago.  He is on full dose ASA.  He has intermittent swelling in the left foot.  I do not think that putting him on formal anti-coagulation right now will help him that much.  I think that adding Folic Acid will help a little.  I will see what his hypercoagulation studies show.  I told him that if he has any problems with pain or swelling in the legs in the future, to please call our office and we will do a Doppler.  I do not think that we need to see him back unless he developes new symptoms.  We spent 45 minutes with him.  Cindee Lame

## 2014-03-19 LAB — HYPERCOAGULABLE PANEL, COMPREHENSIVE
ANTICARDIOLIPIN IGG: 23 GPL U/mL — AB (ref <23)
AntiThromb III Func: 109 % (ref 76–126)
Anticardiolipin IgA: 7 APL U/mL (ref <22)
Anticardiolipin IgM: 9 MPL U/mL (ref <11)
Beta-2 Glyco I IgG: 25 G Units — ABNORMAL HIGH (ref <20)
Beta-2-Glycoprotein I IgA: 15 A Units (ref <20)
Beta-2-Glycoprotein I IgM: 25 M Units — ABNORMAL HIGH (ref <20)
DRVVT: 39.9 s (ref <42.9)
LUPUS ANTICOAGULANT: NOT DETECTED
PROTEIN S ACTIVITY: 96 % (ref 69–129)
PROTEIN S TOTAL: 94 % (ref 60–150)
PTT Lupus Anticoagulant: 38.2 secs (ref 28.0–43.0)
Protein C Activity: 141 % — ABNORMAL HIGH (ref 75–133)
Protein C, Total: 82 % (ref 72–160)

## 2014-12-30 ENCOUNTER — Ambulatory Visit (INDEPENDENT_AMBULATORY_CARE_PROVIDER_SITE_OTHER): Payer: BC Managed Care – PPO | Admitting: Physician Assistant

## 2014-12-30 ENCOUNTER — Encounter (INDEPENDENT_AMBULATORY_CARE_PROVIDER_SITE_OTHER): Payer: Self-pay

## 2014-12-30 VITALS — BP 143/98 | HR 68 | Temp 98.1°F | Resp 15 | Ht 73.0 in | Wt 195.0 lb

## 2014-12-30 DIAGNOSIS — J0110 Acute frontal sinusitis, unspecified: Secondary | ICD-10-CM

## 2014-12-30 MED ORDER — MOMETASONE FUROATE 50 MCG/ACT NA SUSP
2.0000 | Freq: Every day | NASAL | Status: AC
Start: 2014-12-30 — End: ?

## 2014-12-30 MED ORDER — AZITHROMYCIN 250 MG PO TABS
250.0000 mg | ORAL_TABLET | Freq: Every day | ORAL | Status: AC
Start: 2014-12-30 — End: 2015-01-04

## 2014-12-30 MED ORDER — PROMETHAZINE-CODEINE 6.25-10 MG/5ML PO SYRP
5.0000 mL | ORAL_SOLUTION | ORAL | Status: AC | PRN
Start: 2014-12-30 — End: ?

## 2014-12-30 NOTE — Progress Notes (Signed)
Subjective:    Patient ID: Troy Bryan is a 54 y.o. male.    Sinus Problem  This is a new problem. The current episode started in the past 7 days. The problem has been gradually worsening since onset. There has been no fever. Associated symptoms include congestion, sinus pressure and sneezing. Pertinent negatives include no ear pain.   Chest Pain   This is a new problem. The current episode started yesterday. The onset quality is sudden (sneezed). The problem occurs constantly. The problem has been unchanged. The pain is present in the lateral region. The pain is at a severity of 5/10. The quality of the pain is described as sharp. The pain does not radiate. Pertinent negatives include no fever. The pain is aggravated by coughing. He has tried nothing for the symptoms.       The following portions of the patient's history were reviewed and updated as appropriate: allergies, current medications, past family history, past medical history, past social history, past surgical history and problem list.    Review of Systems   Constitutional: Negative for fever.   HENT: Positive for congestion, sinus pressure and sneezing. Negative for ear pain.    Cardiovascular: Positive for chest pain.   Hematological: Negative for adenopathy.         Objective:    BP 143/98 mmHg  Pulse 68  Temp(Src) 98.1 F (36.7 C) (Oral)  Resp 15  Ht 1.854 m (6\' 1" )  Wt 88.451 kg (195 lb)  BMI 25.73 kg/m2    Physical Exam   Constitutional: He appears well-developed and well-nourished. No distress.   HENT:   Head: Normocephalic.   Right Ear: External ear normal.   Left Ear: External ear normal.   Mouth/Throat: Oropharynx is clear and moist.   Eyes: Pupils are equal, round, and reactive to light.   Neck: Normal range of motion. Neck supple.   Cardiovascular: Normal rate, regular rhythm and normal heart sounds.    No murmur heard.  Pulmonary/Chest: Effort normal and breath sounds normal. No stridor. No respiratory distress. He has no wheezes.  He has no rales.     He exhibits tenderness.   Musculoskeletal: Normal range of motion.   Lymphadenopathy:     He has no cervical adenopathy.   Neurological: He is alert.   Skin: Skin is warm and dry. No rash noted. He is not diaphoretic. No erythema. No pallor.   Psychiatric: He has a normal mood and affect.   Nursing note and vitals reviewed.        Assessment and Plan:       Coltin was seen today for sinus problem and chest pain.    Diagnoses and all orders for this visit:    Acute frontal sinusitis, recurrence not specified  Orders:  -     promethazine-codeine (PHENERGAN WITH CODEINE) 6.25-10 MG/5ML syrup; Take 5 mLs by mouth every 4 (four) hours as needed for Cough.  -     azithromycin (ZITHROMAX) 250 MG tablet; Take 1 tablet (250 mg total) by mouth daily. 2 pills today, then 1 pill daily for remaining 4 days  -     mometasone (NASONEX) 50 MCG/ACT nasal spray; 2 sprays by Nasal route daily.            Joyce Gross, PA  [facility]  12/30/2014  7:25 PM

## 2014-12-30 NOTE — Patient Instructions (Signed)
Sinusitis (Antibiotic Treatment)    The sinuses are air-filled spaces within the bones of the face. They connect to the inside of the nose.Sinusitisis an inflammation of the tissue lining the sinus cavity. Sinus inflammation can occur during a cold. It can also be due to allergies to pollens and other particles in the air. Sinusitis can cause symptoms of sinus congestion and fullness. A sinus infection causes fever, headache and facial pain. There is often green or yellow drainage from the nose or into the back of the throat (post-nasal drip). You have been given antibiotics to treat this condition.  Home care:   Take the full course of antibiotics as instructed. Do not stop taking them, even if you feel better.   Drink plenty of water, hot tea, and other liquids. This may help thin mucus. It also may promote sinus drainage.   Heat may help soothe painful areas of the face. Use a towel soaked in hot water. Or, stand in the shower and direct the hot spray onto your face. Using a vaporizer along with a menthol rub at night may also help.   Anexpectorantcontaining guaifenesin may help thin the mucus and promote drainage from the sinuses.   Over-the-counterdecongestantsmay be used unless a similar medicine was prescribed. Nasal sprays work the fastest. Use one that contains phenylephrine or oxymetazoline. First blow the nose gently. Then use the spray. Do not use these medicines more often than directed on the label or symptoms may get worse. You may also use tablets containing pseudoephedrine. Avoid products that combine ingredients, because side effects may be increased. Read labels. You can also ask the pharmacist for help. (NOTE:Persons with high blood pressure should not use decongestants. They can raise blood pressure.)   Over-the-counterantihistaminesmay help if allergies contributed to your sinusitis.    Do not use nasal rinses or irrigation during an acute sinus infection, unless told to by  your health care provider. Rinsing may spread the infection to other sinuses.   Use acetaminophen or ibuprofen to control pain, unless another pain medicine was prescribed. (If you have chronic liver or kidney disease or ever had a stomach ulcer, talk with your doctor before using these medicines. Aspirin should never be used in anyone under 18 years of age who is ill with a fever. It may cause severe liver damage.)   Don't smoke. This can worsen symptoms.  Follow-up care  Follow up with your healthcare provider or our staff if you are not improving within the next week.  When to seek medical advice  Call your healthcare provider if any of these occur:   Facial pain or headache becoming more severe   Stiff neck   Unusual drowsiness or confusion   Swelling of the forehead or eyelids   Vision problems, including blurred or double vision   Fever of100.4F (38C)or higher, or as directed by your healthcare provider   Seizure   Breathing problems   Symptoms not resolving within 10 days   2000-2015 [employer] [address], [place] [address]. All rights reserved. This information is not intended as a substitute for professional medical care. Always follow your healthcare professional's instructions.

## 2015-01-02 ENCOUNTER — Telehealth (INDEPENDENT_AMBULATORY_CARE_PROVIDER_SITE_OTHER): Payer: Self-pay

## 2015-01-02 NOTE — Telephone Encounter (Signed)
Patient is feeling better.

## 2015-04-05 ENCOUNTER — Ambulatory Visit
Admission: RE | Admit: 2015-04-05 | Discharge: 2015-04-05 | Disposition: A | Discharge status: Home / Self Care | Payer: BC Managed Care – PPO | Source: Ambulatory Visit | Attending: Urology | Admitting: Urology

## 2015-04-05 DIAGNOSIS — E291 Testicular hypofunction: Secondary | ICD-10-CM | POA: Insufficient documentation

## 2015-04-05 LAB — CBC AND DIFFERENTIAL
Basophils %: 0.6 % (ref 0.0–3.0)
Basophils Absolute: 0.1 10*3/uL (ref 0.0–0.3)
Eosinophils %: 3.1 % (ref 0.0–7.0)
Eosinophils Absolute: 0.3 10*3/uL (ref 0.0–0.8)
Hematocrit: 47.3 % (ref 39.0–52.5)
Hemoglobin: 16.7 gm/dL (ref 13.0–17.5)
Lymphocytes Absolute: 2.4 10*3/uL (ref 0.6–5.1)
Lymphocytes: 26.8 % (ref 15.0–46.0)
MCH: 32 pg (ref 28–35)
MCHC: 35 gm/dL (ref 32–36)
MCV: 91 fL (ref 80–100)
MPV: 7.7 fL (ref 6.0–10.0)
Monocytes Absolute: 0.7 10*3/uL (ref 0.1–1.7)
Monocytes: 8.4 % (ref 3.0–15.0)
Neutrophils %: 61.1 % (ref 42.0–78.0)
Neutrophils Absolute: 5.4 10*3/uL (ref 1.7–8.6)
PLT CT: 240 10*3/uL (ref 130–440)
RBC: 5.18 10*6/uL (ref 4.00–5.70)
RDW: 11.2 % (ref 11.0–14.0)
WBC: 8.8 10*3/uL (ref 4.0–11.0)

## 2015-04-05 LAB — VH TESTOSTERONE: Testosterone: 343.5 ng/dL (ref 221.0–716.0)

## 2015-04-05 LAB — PSA: PSA: 3.3 ng/mL (ref 0.000–4.000)

## 2015-11-06 IMAGING — US US EXTREM LOW VENOUS*L*
1 series · 13 of 24 positions shown · non-contrast
Comparison: None.

CLINICAL DATA: Leg pain and edema



[Series 1: us extrem low venous*left* · 13 of 38 slices shown]
[im 1/38]
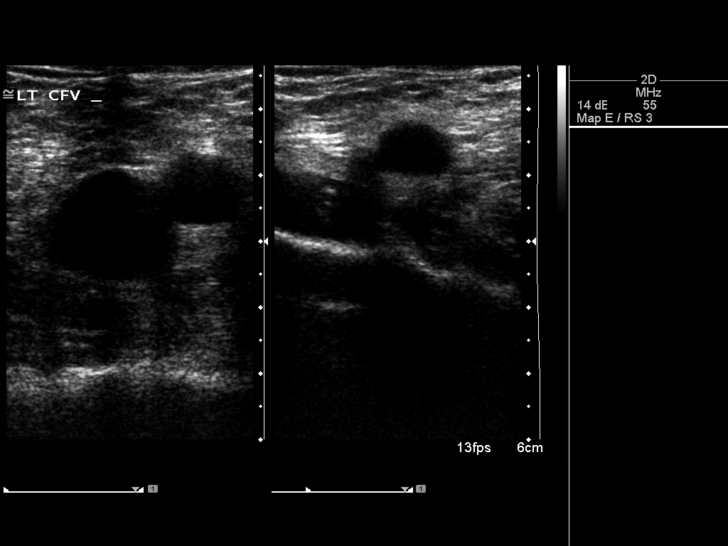
[im 4/38]
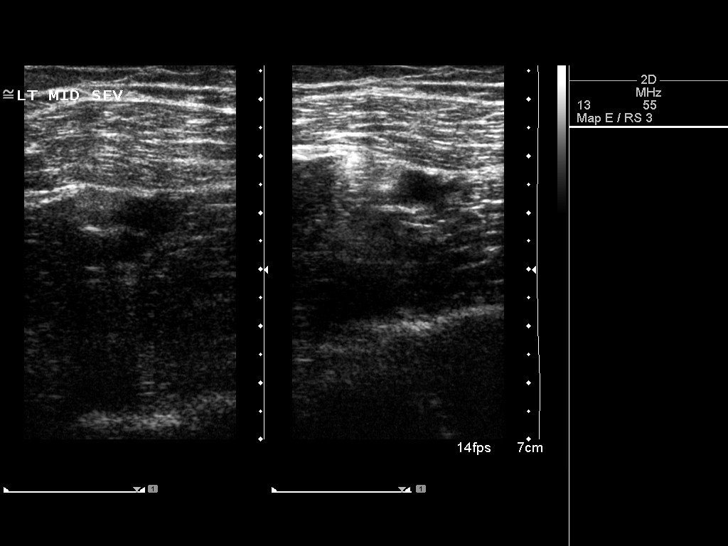
[im 7/38]
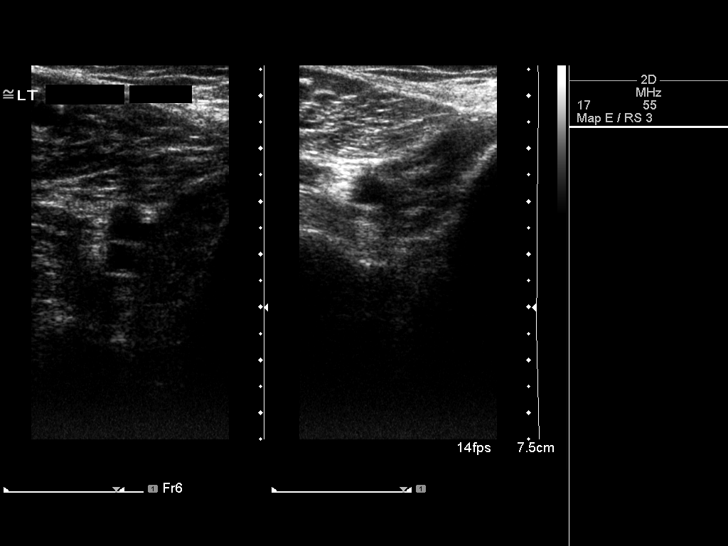
[im 10/38]
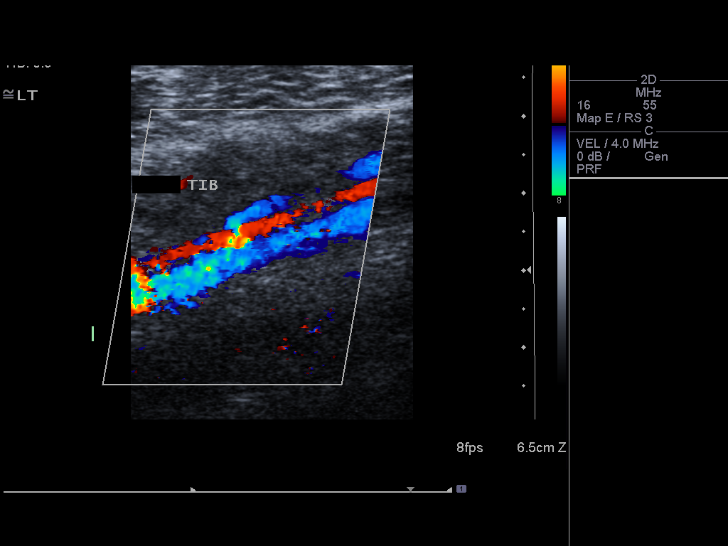
[im 13/38]
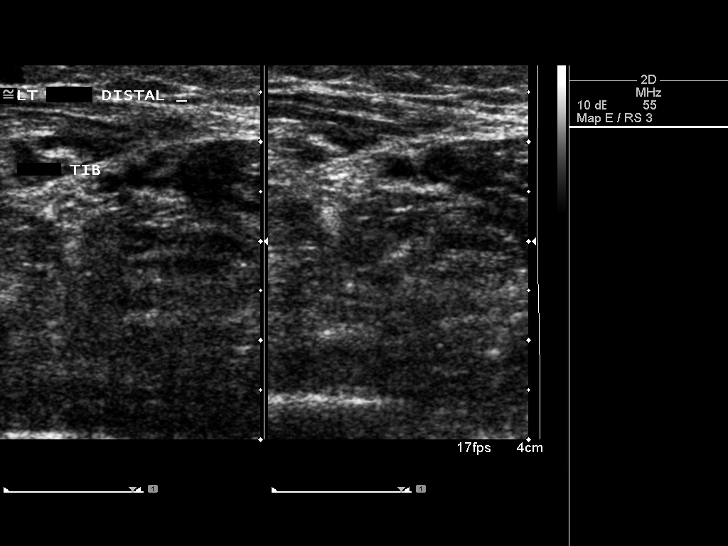
[im 17/38]
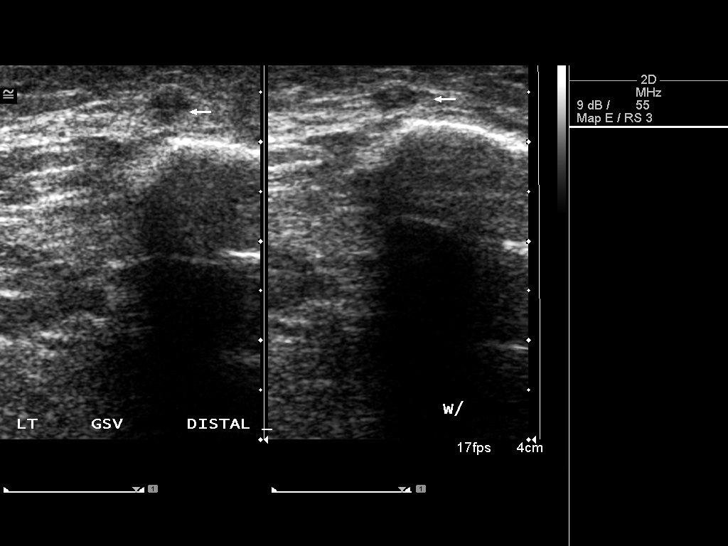
[im 20/38]
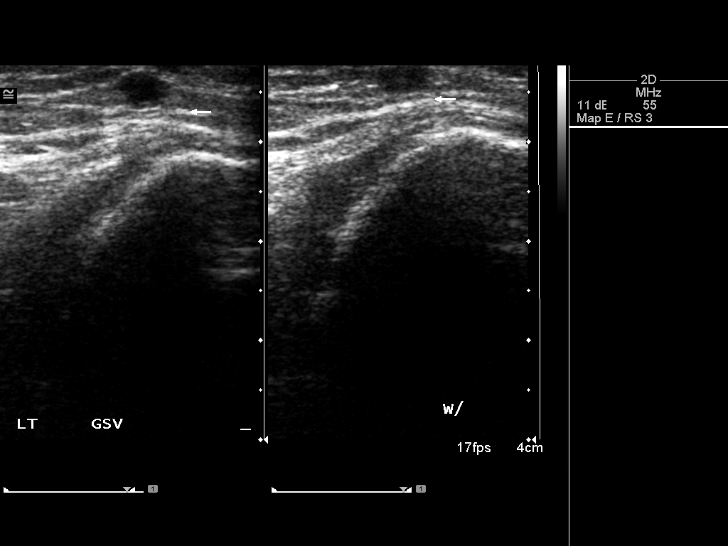
[im 21/38]
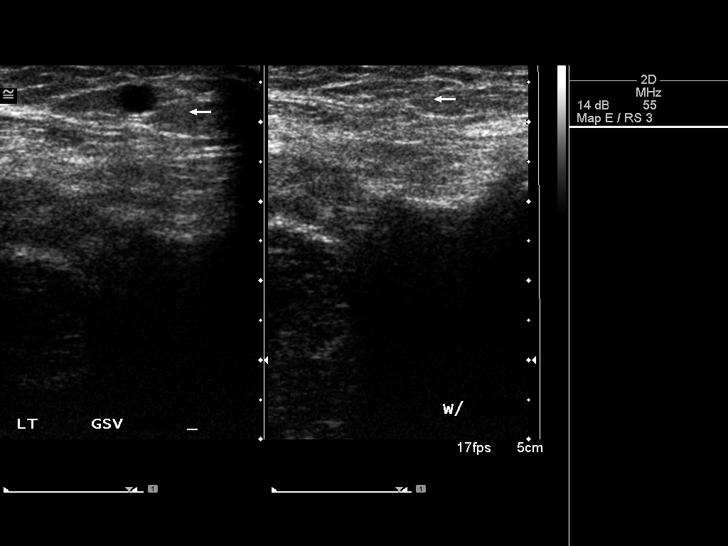
[im 25/38]
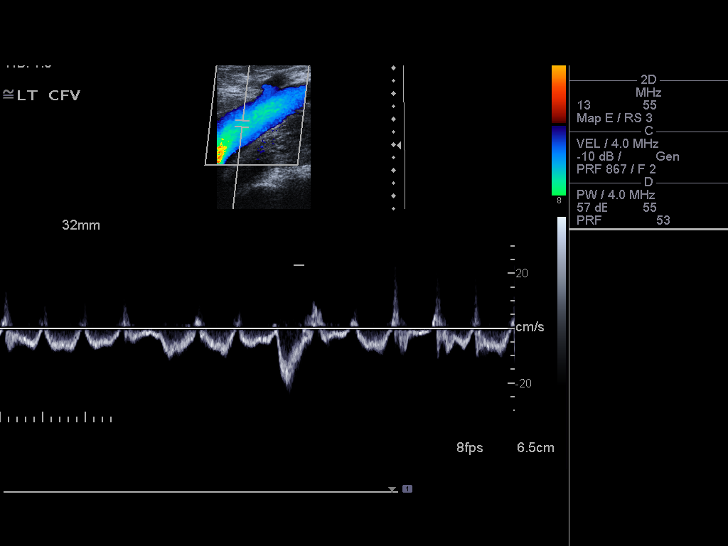
[im 28/38]
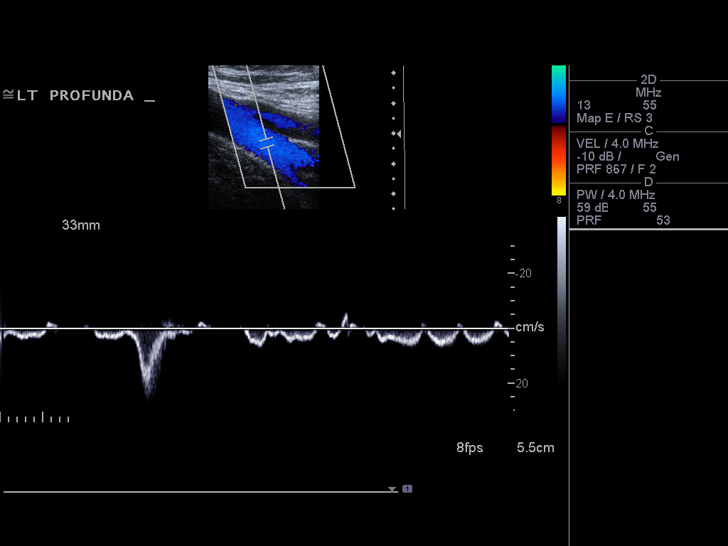
[im 31/38]
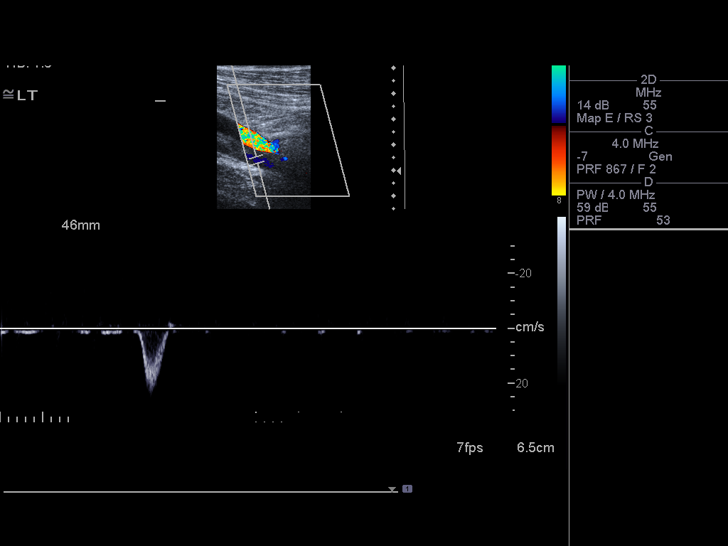
[im 34/38]
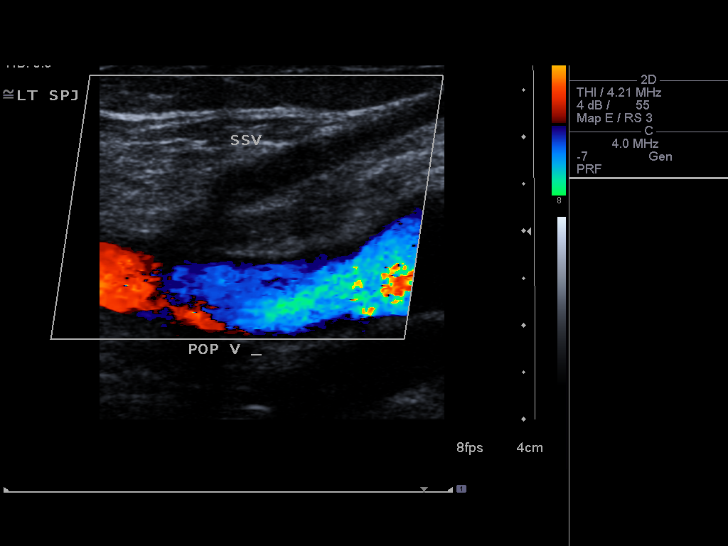
[im 38/38]
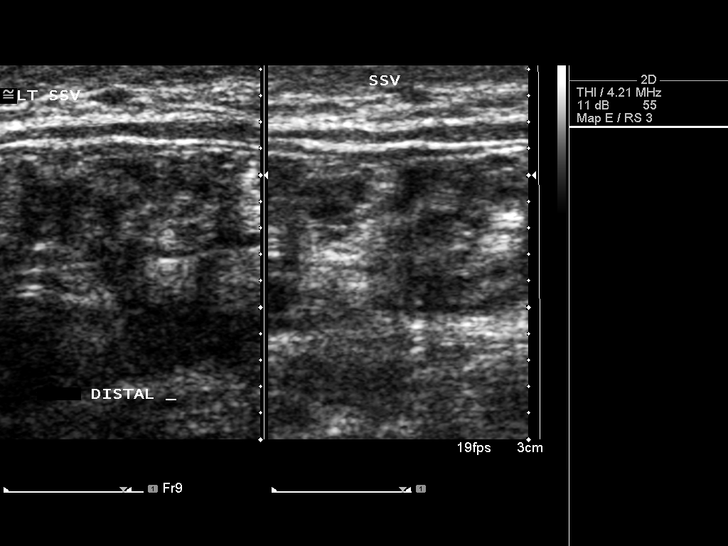

[13 of 24 positions shown; findings below may reference images not displayed]

FINDINGS: Common Femoral Vein: No evidence of thrombus. Normal
compressibility, respiratory phasicity and response to augmentation.

Saphenofemoral Junction: No evidence of thrombus. Normal
compressibility and flow on color Doppler imaging.

Profunda Femoral Vein: No evidence of thrombus. Normal
compressibility and flow on color Doppler imaging.

Femoral Vein: No evidence of thrombus. Normal compressibility,
respiratory phasicity and response to augmentation.

Popliteal Vein: No evidence of thrombus. Normal compressibility,
respiratory phasicity and response to augmentation.

Calf Veins: No evidence of thrombus. Normal compressibility and flow
on color Doppler imaging.

Superficial Great Saphenous Vein: Left distal GSV and SSV
demonstrate intraluminal thrombus, noncompressibility and no color
Doppler flow. Findings compatible with left GSV and SSV superficial
thrombosis/ thrombophlebitis.

Venous Reflux:  None.

Other Findings:  None.
IMPRESSION: Negative for DVT.

Left GSV and SSV superficial thrombosis/thrombophlebitis
